# Patient Record
Sex: Male | Born: 1995 | Race: White | Hispanic: No | Marital: Single | Smoking: Never smoker
Health system: Southern US, Community
[De-identification: ages and names within clinical notes are randomized; demographics above are authoritative.]

## PROBLEM LIST (undated history)

## (undated) HISTORY — PX: OTHER SURGICAL HISTORY: SHX169

## (undated) HISTORY — PX: TONSILLECTOMY: SUR1361

---
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
## 2017-01-09 ENCOUNTER — Ambulatory Visit (INDEPENDENT_AMBULATORY_CARE_PROVIDER_SITE_OTHER): Payer: 59 | Admitting: Family Medicine

## 2017-01-09 ENCOUNTER — Telehealth: Payer: Self-pay | Admitting: Family Medicine

## 2017-01-09 ENCOUNTER — Ambulatory Visit (INDEPENDENT_AMBULATORY_CARE_PROVIDER_SITE_OTHER): Payer: 59

## 2017-01-09 ENCOUNTER — Encounter: Payer: Self-pay | Admitting: Family Medicine

## 2017-01-09 VITALS — BP 108/64 | HR 70 | Ht 72.0 in | Wt 168.0 lb

## 2017-01-09 DIAGNOSIS — M25512 Pain in left shoulder: Secondary | ICD-10-CM | POA: Diagnosis not present

## 2017-01-09 DIAGNOSIS — M7582 Other shoulder lesions, left shoulder: Secondary | ICD-10-CM

## 2017-01-09 MED ORDER — GADOBENATE DIMEGLUMINE 529 MG/ML IV SOLN
5.0000 mL | Freq: Once | INTRAVENOUS | Status: AC | PRN
  Administered 2017-01-09: 1 mL via INTRAVENOUS

## 2017-01-09 NOTE — Patient Instructions (Signed)
Thank you for coming in today. Get MRI now.  Call or go to the ER if you develop a large red swollen joint with extreme pain or oozing puss.  Use Chlorhexidine body wash.  Hibiclens is a good brand over the counter.  Use it every few days.

## 2017-01-09 NOTE — Progress Notes (Signed)
    Patient presents to clinic for  previously scheduled gadolinium interarticular contrast.  Procedure: Real-time Ultrasound Guided Injection of left GH injection  Device: GE Logiq E  Images permanently stored and available for review in the ultrasound unit. Verbal informed consent obtained. Discussed risks and benefits of procedure. Warned about infection bleeding damage to structures skin hypopigmentation and fat atrophy among others. Patient expresses understanding and agreement Time-out conducted.  Noted no overlying erythema, induration, or other signs of local infection.  Skin prepped in a sterile fashion.  Local anesthesia: Topical Ethyl chloride.  With sterile technique and under real time ultrasound guidance: 5ml marcaine, 0.6005ml gadolinium and 6ml sterile saline injected easily.  Completed without difficulty    Advised to call if fevers/chills, erythema, induration, drainage, or persistent bleeding.  Images permanently stored and available for review in the ultrasound unit.  Impression: Technically successful ultrasound guided injection.

## 2017-01-09 NOTE — Telephone Encounter (Signed)
I spoke with Andrew Boyd about his shoulder MRI results.  Plan for dedicated physical therapy and recheck as needed.

## 2017-01-16 ENCOUNTER — Ambulatory Visit (INDEPENDENT_AMBULATORY_CARE_PROVIDER_SITE_OTHER): Payer: 59 | Admitting: Physical Therapy

## 2017-01-16 DIAGNOSIS — M25512 Pain in left shoulder: Secondary | ICD-10-CM | POA: Diagnosis not present

## 2017-01-16 DIAGNOSIS — R293 Abnormal posture: Secondary | ICD-10-CM | POA: Diagnosis not present

## 2017-01-16 DIAGNOSIS — M6281 Muscle weakness (generalized): Secondary | ICD-10-CM

## 2017-01-16 NOTE — Patient Instructions (Addendum)
Scapula Adduction With Pectorals, Low   Stand in doorframe with palms against frame and arms at 45. Lean forward and squeeze shoulder blades. Hold __45_ seconds. Repeat _1__ times per session. Do _2__ sessions per day.  Scapula Adduction With Pectorals, Mid-Range   Stand in doorframe with palms against frame and arms at 90. Step forward and squeeze shoulder blades. Hold _45__ seconds. Repeat _1__ times per session. Do 2___ sessions per day. \Scapula Adduction With Pectorals, High - ONLY PERFORM IF PAIN IS LESS THAN 4/10   Stand in doorframe with palms against frame and arms at 120. Step forward and squeeze shoulder blades. Hold _45__ seconds. Repeat __1_ times per session. Do _2__ sessions per day.      Scapular Retraction: Abduction / Extension (Prone) - perform with palms down and then thumbs up.     Lie with arms out from sides 90. Pinch shoulder blades together and raise arms a few inches from floor. Repeat __10__ times per set. Do __3__ sets per session. Do __1_ sessions per day.  Scapular Retraction: Abduction (Prone)    Lie with upper arms straight out from sides, elbows bent to 90. Pinch shoulder blades together and raise arms a few inches from floor. Repeat __10__ times per set. Do __3__ sets per session. Do __1__ sessions per day.    Work on posture and position.  Keep shoulder blades pulled back and down like placing them in back pockets.  Don't perform floor push ups until shoulder pain is down.  Can do on a counter or table.    Use ice for pain as needed.

## 2017-01-16 NOTE — Therapy (Signed)
[facility] [address] [place], [place], [address] Phone: [phone]   Fax:  [phone]  Physical Therapy Evaluation  Patient Details  Name: Andrew NonesJoshua Madonia MRN: [id number] Date of Birth: 03/29/1995 Referring Provider: Dr Denyse Amassorey   Encounter Date: 01/16/2017  PT End of Session - 01/16/17 0849    Visit Number  1    Number of Visits  3    Date for PT Re-Evaluation  01/23/17    PT Start Time  0849    PT Stop Time  0922    PT Time Calculation (min)  33 min       No past medical history on file.  Past Surgical History:  Procedure Laterality Date  . neck     removal of nail   . TONSILLECTOMY      There were no vitals filed for this visit.   Subjective Assessment - 01/16/17 0849    Subjective  Josh reports he  began having Lt shoulder pain about 3 months ago, he was moving so he didn't get it checked up until now.  He is home for winter break. 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  No/denies has pain with pushups, bench presses         [facility] 01/16[facility]17 0001      Assessment   Medical Diagnosis  Lt shoulder pain     Referring Provider  Dr Denyse Amassorey    Onset Date/Surgical Date  10/16/16    Hand Dominance  Right    Next MD Visit  PRN    Prior Therapy  none      Precautions   Precautions  None      Balance Screen   Has the patient fallen in the past 6 months  No      Prior Function   Level of Independence  Independent    Teaching laboratory technicianVocation  Student    Vocation Requirements  college student    Leisure  work out, hunting, fishing, jutisu      Observation/Other Assessments   Focus on Therapeutic Outcomes (FOTO)   27% Limited      Posture/Postural Control   Posture/Postural Control  Postural limitations    Postural Limitations  Forward head;Rounded Shoulders      ROM /  Strength   AROM / PROM / Strength  AROM;Strength      AROM   Overall AROM Comments  rhomboids/mid traps 4/5    AROM Assessment Site  Shoulder;Cervical    Right/Left Shoulder  -- bilat WNL    Cervical Flexion  WNL    Cervical Extension  WNL    Cervical - Right Side Bend  WNL    Cervical - Left Side Bend  WNL    Cervical - Right Rotation  WNL    Cervical - Left Rotation  WNL      Strength   Strength Assessment Site  Shoulder;Elbow    Right/Left Shoulder  -- grossly WNL except Lt IR 5-/5    Right/Left Elbow  -- WNL      Palpation   Palpation comment  banding and tightness posterior Lt shoulder RTC tendons. Tight pecs bilat             Objective measurements completed on examination: See above findings.  [facility] Adult PT Treatment/Exercise - 01/16/17 0001      Exercises   Exercises  Shoulder      Shoulder Exercises: Prone   Other Prone Exercises  3x10 T's thumbs up and palms down, arms slightly lower than 90 degrees to minimize Lt shoulder pain    Other Prone Exercises  3x10 prone "goal posts" per HEP       Shoulder Exercises: Stretch   Other Shoulder Stretches  3 way door way stretch x 45 sec each              PT Education - 01/16/17 0911    Education provided  Yes    Education Details  HEP    Person(s) Educated  Patient    Methods  Explanation;Demonstration    Comprehension  Returned demonstration;Verbalized understanding          PT Long Term Goals - 01/16/17 1027      PT LONG TERM GOAL #1   Title  I with HEP for shoulder and upper back strengthening ( 01/20/17)     Time  1    Period  Weeks    Status  New      PT LONG TERM GOAL #2   Title  maintain FOTO within 5 points as he will not be able to come after this week ( 01/20/17)     Time  1    Period  Weeks    Status  New             Plan - 01/16/17 0926    Clinical Impression Statement  22 yo male home from college for winter break.  Returns to school this sunday.  He has Lt  shoulder pain for about 3 months, has continued to work out as he has basic training this summer and wants to b able to perform the required pushups and other physical  demands. He has significant forward posture, rounded shoulders along with upper back weakness and trigger oints in posterior Lt RTC muscles. He wishes to learn what exercises to perform when he returns to school next week that won't hurt his shoulder and will allow him to prepare for his summer.     Clinical Presentation  Stable    Clinical Decision Making  Low    Rehab Potential  Good for goals set    PT Frequency  3x / week    PT Duration  -- 1 week    PT Treatment/Interventions  Iontophoresis 4mg /ml Dexamethasone;Therapeutic activities;Patient/family education;Therapeutic exercise;Ultrasound;Cryotherapy;Civil engineer, contracting;Taping    PT Next Visit Plan  trigger point release with ball posterior Lt shoulder, add band ex for upper back and RTC strengthenin    Consulted and Agree with Plan of Care  Patient       Patient will benefit from skilled therapeutic intervention in order to improve the following deficits and impairments:  Impaired UE functional use, Decreased strength, Pain, Improper body mechanics  Visit Diagnosis: Acute pain of left shoulder - Plan: PT plan of care cert/re-cert  Muscle weakness (generalized) - Plan: PT plan of care cert/re-cert  Abnormal posture - Plan: PT plan of care cert/re-cert     Problem List There are no active problems to display for this patient.   Roderic Scarce PT  01/16/2017, 10:30 AM  [facility] [address] [place], [place], [address] Phone: [phone]   Fax:  [phone]  Name: Laden Fieldhouse MRN: [id number] Date of Birth: 27-Feb-1995

## 2017-01-18 ENCOUNTER — Ambulatory Visit (INDEPENDENT_AMBULATORY_CARE_PROVIDER_SITE_OTHER): Payer: 59 | Admitting: Physical Therapy

## 2017-01-18 DIAGNOSIS — R293 Abnormal posture: Secondary | ICD-10-CM

## 2017-01-18 DIAGNOSIS — M6281 Muscle weakness (generalized): Secondary | ICD-10-CM

## 2017-01-18 DIAGNOSIS — M25512 Pain in left shoulder: Secondary | ICD-10-CM

## 2017-01-18 NOTE — Patient Instructions (Signed)
Resisted External Rotation: in Neutral - Bilateral  PALMS UP!!! Sit or stand, tubing in both hands, elbows at sides, bent to 90, forearms forward. Pinch shoulder blades together and rotate forearms out. Keep elbows at sides. Repeat __10__ times per set. Do __2-3__ sets per session. Do __3-4__ sessions per week.  Resistive Band Rowing   With resistive band anchored in door, grasp both ends. Keeping elbows bent, pull back, squeezing shoulder blades together. Hold _3-5___ seconds. Repeat _10-30___ times. Do __1__ sessions per day.   Strengthening: Resisted Extension   Hold tubing with both hands, arms forward. Pull arms back, elbow straight. Repeat _10-30___ times per set. Do ____ sets per session. Do _1___ sessions per day.    * ball massage 3-5 min to pecs and back of shoulder.   * ice shoulder 15 min after workout if painful/irritated.

## 2017-01-18 NOTE — Therapy (Signed)
[facility] [address] [place], [place], [address] Phone: [phone]   Fax:  [phone]  Physical Therapy Treatment  Patient Details  Name: Andrew Boyd MRN: [id number] Date of Birth: 12/22/1995 Referring Provider: Dr. Denyse Amassorey   Encounter Date: 01/18/2017  PT End of Session - 01/18/17 0928    Visit Number  2    Number of Visits  3    Date for PT Re-Evaluation  01/23/17    PT Start Time  0849    PT Stop Time  0929    PT Time Calculation (min)  40 min    Activity Tolerance  Patient tolerated treatment well;No increased pain    Behavior During Therapy  [facility] for tasks assessed/performed       No past medical history on file.  Past Surgical History:  Procedure Laterality Date  . neck     removal of nail   . TONSILLECTOMY      There were no vitals filed for this visit.  Subjective Assessment - 01/18/17 0939    Subjective  Pt reports he had pain in Lt shoulder with pull ups yesterday.  He is anxious to return to push ups for preparation of basic training.     Currently in Pain?  No/denies         [facility] - 01/18/17 0001      Assessment   Medical Diagnosis  Lt shoulder pain     Referring Provider  Dr. Denyse Amassorey    Onset Date/Surgical Date  10/16/16    Hand Dominance  Right    Next MD Visit  PRN        [facility]/Exercise - 01/18/17 0001      Self-Care   Self-Care  Other Self-Care Comments;Heat/Ice Application    Heat/Ice Application  educated pt on parameters and rationale of ice application (along with use of home estim)    Other Self-Care Comments   Pt educated on self massage/ TPR with ball to shoulder girdle to reduce fascial tightness; pt returned demo and verbalized understanding      Shoulder Exercises: Prone   Flexion  Right;Left;10 reps;Weights 2 sets, 1st without wt    Flexion Weight (lbs)  2    Other Prone Exercises  T's thumbs up and palms down, arms slightly lower  than 90 degrees to minimize Lt shoulder pain, repeated with 2# (2 sets of 10)   Other Prone Exercises  prone "goal posts" x 10, repeated with 2#.       Shoulder Exercises: Standing   External Rotation  Both;20 reps;Theraband    Theraband Level (Shoulder External Rotation)  Level 3 (Green)    Extension  Both;20 reps;Theraband 5 sec hold with scap retraction     Theraband Level (Shoulder Extension)  Level 3 (Green)    Row  Both;10 reps;Theraband 5 sec hold in retraction      Shoulder Exercises: ROM/Strengthening   UBE (Upper Arm Bike)  L2:  1 min forward, 2 min backward       Shoulder Exercises: Stretch   Other Shoulder Stretches  3 way door way stretch x 30 sec each, 2 sets     Other Shoulder Stretches  shoulder ext stretch with hand on door frame, 30 sec x 2 , 2 sets                  PT Long Term Goals - 01/16/17 1027      PT LONG  TERM GOAL #1   Title  I with HEP for shoulder and upper back strengthening ( 01/20/17)     Time  1    Period  Weeks    Status  New      PT LONG TERM GOAL #2   Title  maintain FOTO within 5 points as he will not be able to come after this week ( 01/20/17)     Time  1    Period  Weeks    Status  New            Plan - 01/18/17 0930    Clinical Impression Statement  Pt tolerated exercise with increased resistance without any production of symptoms.  He needed freq cues for posture and to slow speed of exercise.  Added posterior shoulder girdle exercises without difficulty.  Pt progressing well towards established goals.     Rehab Potential  Good    PT Frequency  3x / week    PT Treatment/Interventions  Iontophoresis 4mg /ml Dexamethasone;Therapeutic activities;Patient/family education;Therapeutic exercise;Ultrasound;Cryotherapy;Civil engineer, contracting;Taping    PT Next Visit Plan  Finalize HEP for d/c (pt leaves for college on Sunday) FOTO    Consulted and Agree with Plan of Care  Patient       Patient will benefit  from skilled therapeutic intervention in order to improve the following deficits and impairments:  Impaired UE functional use, Decreased strength, Pain, Improper body mechanics  Visit Diagnosis: Acute pain of left shoulder  Muscle weakness (generalized)  Abnormal posture     Problem List There are no active problems to display for this patient.  Mayer Camel, PTA 01/18/17 9:43 AM  [facility] [address] [place], [place], [address] Phone: [phone]   Fax:  [phone]  Name: Andrew Boyd MRN: [id number] Date of Birth: [birth year]

## 2017-01-20 ENCOUNTER — Ambulatory Visit (INDEPENDENT_AMBULATORY_CARE_PROVIDER_SITE_OTHER): Payer: 59 | Admitting: Physical Therapy

## 2017-01-20 DIAGNOSIS — M6281 Muscle weakness (generalized): Secondary | ICD-10-CM

## 2017-01-20 DIAGNOSIS — M25512 Pain in left shoulder: Secondary | ICD-10-CM | POA: Diagnosis not present

## 2017-01-20 DIAGNOSIS — R293 Abnormal posture: Secondary | ICD-10-CM

## 2017-01-20 NOTE — Patient Instructions (Signed)
Push-Up Plus: On Toes    Place hands on floor and balance on toes. Perform a push-up. Give an extra push at end to bring shoulder blades forward on rib cage. Repeat ___ times per set. Rest ___ seconds after set. Do ___ sets per session.  * Plank with shoulder taps * walking planks   Chest / Bicep Stretch    Lace fingers behind back and squeeze shoulder blades together. Slowly raise and straighten arms. Hold __30__ seconds. Repeat _3___ times per set. Do __2__ sets per session.    [facility] [address] [place], [place] [address]  [phone] (office) [phone] (fax)

## 2017-01-20 NOTE — Therapy (Addendum)
[facility] [address] [place], [place], [address] Phone: [phone]   Fax:  [phone]  Physical Therapy Treatment  Patient Details  Name: Andrew Boyd MRN: [id number] Date of Birth: 04/05/1995 Referring Provider: Dr. Corey   Encounter Date: 01/20/2017  PT End of Session - 01/20/17 0858    Visit Number  3    Number of Visits  3    Date for PT Re-Evaluation  01/23/17    PT Start Time  0850    PT Stop Time  0928    PT Time Calculation (min)  38 min    Activity Tolerance  Patient tolerated treatment well;No increased pain    Behavior During Therapy  WFL for tasks assessed/performed       No past medical history on file.  Past Surgical History:  Procedure Laterality Date  . neck     removal of nail   . TONSILLECTOMY      There were no vitals filed for this visit.  Subjective Assessment - 01/20/17 0903    Subjective  Josh reports he was able to complete 60 incline push ups and 20 reg push ups without any pain. He           [facility] PT Assessment - 01/20/17 0001      Assessment   Medical Diagnosis  Lt shoulder pain     Referring Provider  Dr. Corey    Onset Date/Surgical Date  10/16/16    Hand Dominance  Right    Next MD Visit  PRN      Precautions   Precautions  None      Observation/Other Assessments   Focus on Therapeutic Outcomes (FOTO)   18% limited                  [facility] Adult PT Treatment/Exercise - 01/20/17 0001      Shoulder Exercises: Prone   Other Prone Exercises  prone on green therapy ball:  with 3# in each hand- T's, fly's, and goal posts -10 reps, 2 sets    Other Prone Exercises  prone goal post to superman bilat x 10 reps      Shoulder Exercises: Standing   External Rotation  Both;20 reps;Theraband;Limitations    Theraband Level (Shoulder External Rotation)  Level 3 (Green)    External Rotation Limitations  followed by 20 pulses in ER x 2 sets      Shoulder  Exercises: ROM/Strengthening   UBE (Upper Arm Bike)  L4: 2 min forward/ 2 min backward    Other ROM/Strengthening Exercises  Plank shoulder taps, with push ups x 10; opp arm leg in plank x 10 (poor form at times despite cues; push up plus x 10. Pt shown variations on plank/push up variations.       Shoulder Exercises: Stretch   Other Shoulder Stretches  3 way door way stretch x 30 sec each, 2 sets     Other Shoulder Stretches  shoulder ext stretch with hand on door frame, 30 sec x 2 , 2 sets                  PT Long Term Goals - 01/20/17 1519      PT LONG TERM GOAL #1   Title  I with HEP for shoulder and upper back strengthening ( 01/20/17)     Time  1    Period  Weeks    Status  Achieved      PT   LONG TERM GOAL #2   Title  maintain FOTO within 5 points as he will not be able to come after this week ( 01/20/17)     Time  1    Period  Weeks    Status  Achieved            Plan - 01/20/17 1520    Clinical Impression Statement  Pt tolerated exercises with increased difficulty without any production in symptoms.  Pt reported reduced pain with exercise outside of therapy yesterday.  Pt scored 18% limited on FOTO survey; improved from intake. Pt has met his goals and verbalized readiness for d/c.     Rehab Potential  Good    PT Frequency  3x / week    PT Treatment/Interventions  Iontophoresis 75m/ml Dexamethasone;Therapeutic activities;Patient/family education;Therapeutic exercise;Ultrasound;Cryotherapy;EHealth visitorTaping    PT Next Visit Plan  Spoke to supervising PT; will d/c to HEP.     Consulted and Agree with Plan of Care  Patient       Patient will benefit from skilled therapeutic intervention in order to improve the following deficits and impairments:  Impaired UE functional use, Decreased strength, Pain, Improper body mechanics  Visit Diagnosis: Acute pain of left shoulder  Muscle weakness (generalized)  Abnormal  posture     Problem List There are no active problems to display for this patient.   CShelbie Hutching1/11/2017, 3:23 PM  [facility] [address]Phone: [phone]  Fax:  [phone] Name: JXsavier SeeleyMRN: [id number] of Birth: [birth year]  PHYSICAL THERAPY DISCHARGE SUMMARY  Visits from Start of Care: 3  Current functional level related to goals / functional outcomes: See above   Remaining deficits: none   Education / Equipment:  HEP Plan: Patient agrees to discharge.  Patient goals were met. Patient is being discharged due to meeting the stated rehab goals.  ?????    SJeral Pinch PT 02/22/17 3:08 PM

## 2019-11-27 IMAGING — DX DG SHOULDER 2+V*L*
3 series · 3 of 3 positions shown · non-contrast
Comparison: None.

CLINICAL DATA: Left shoulder pain

EXAM:
LEFT SHOULDER - 2+ VIEW

[shoulder grashey]
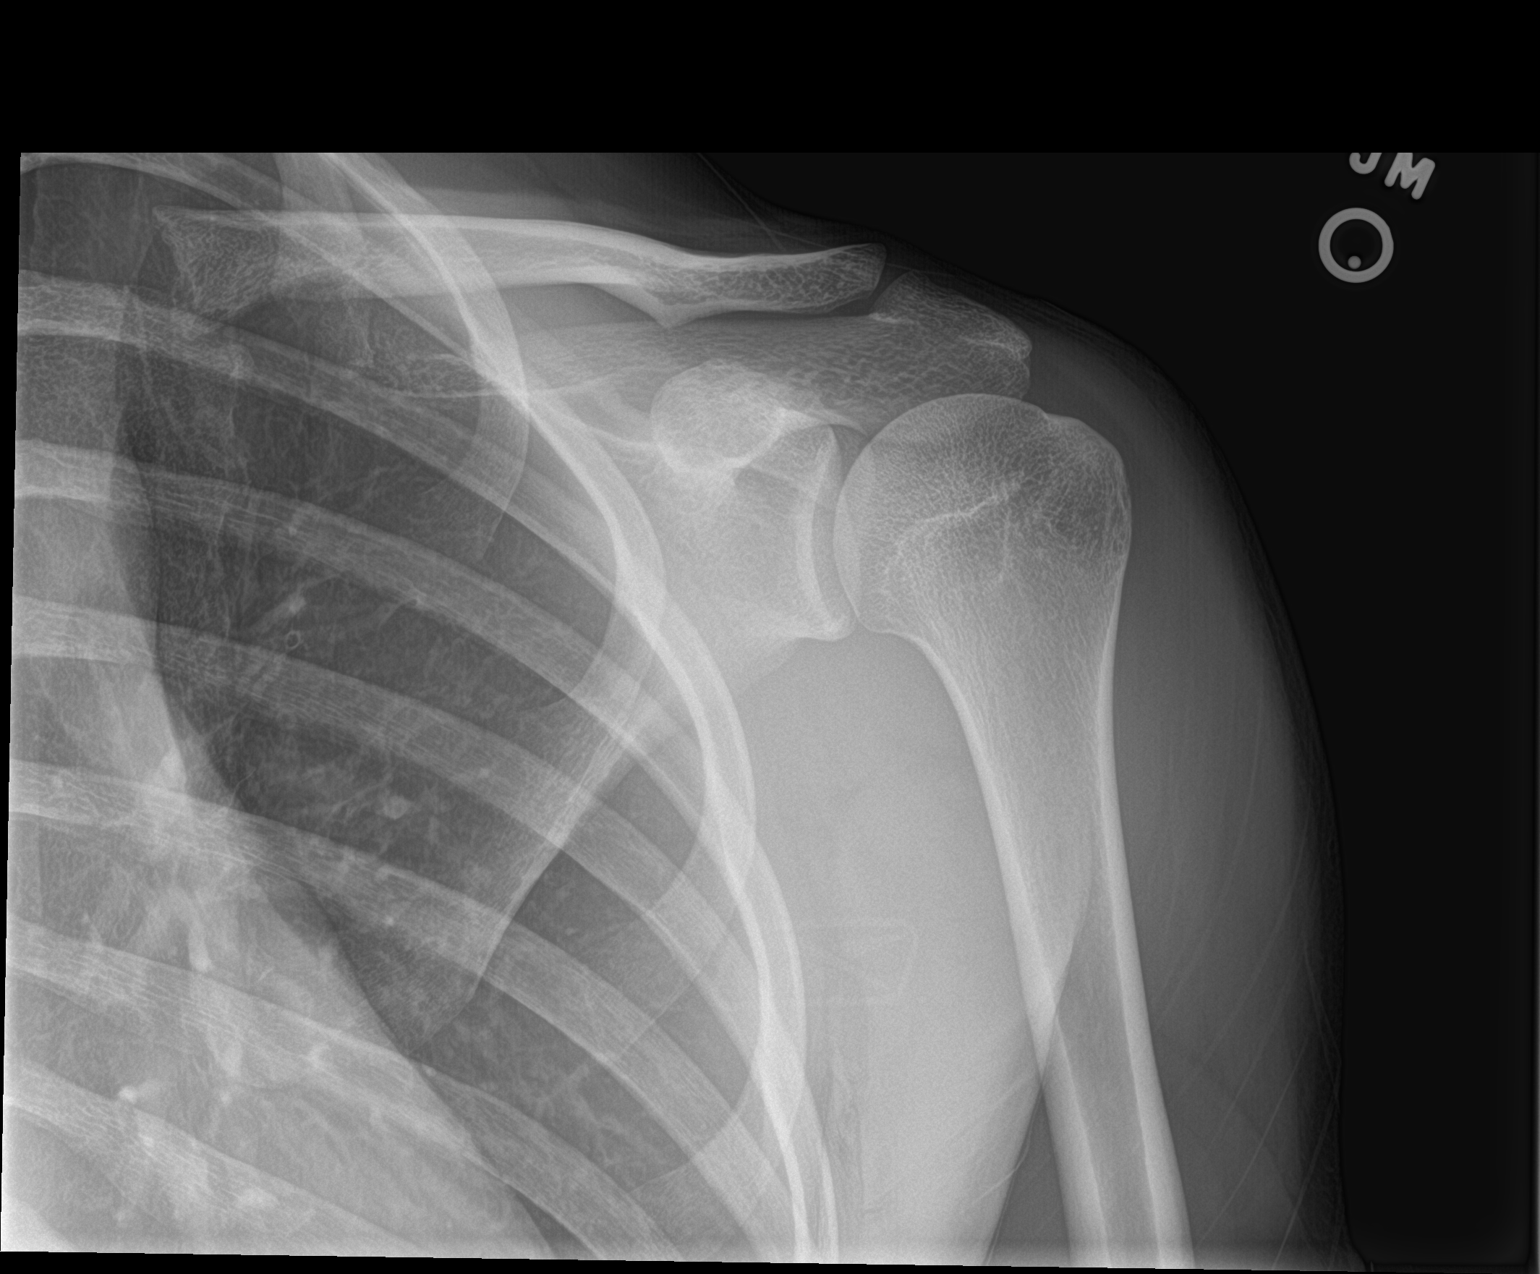

[shoulder y view]
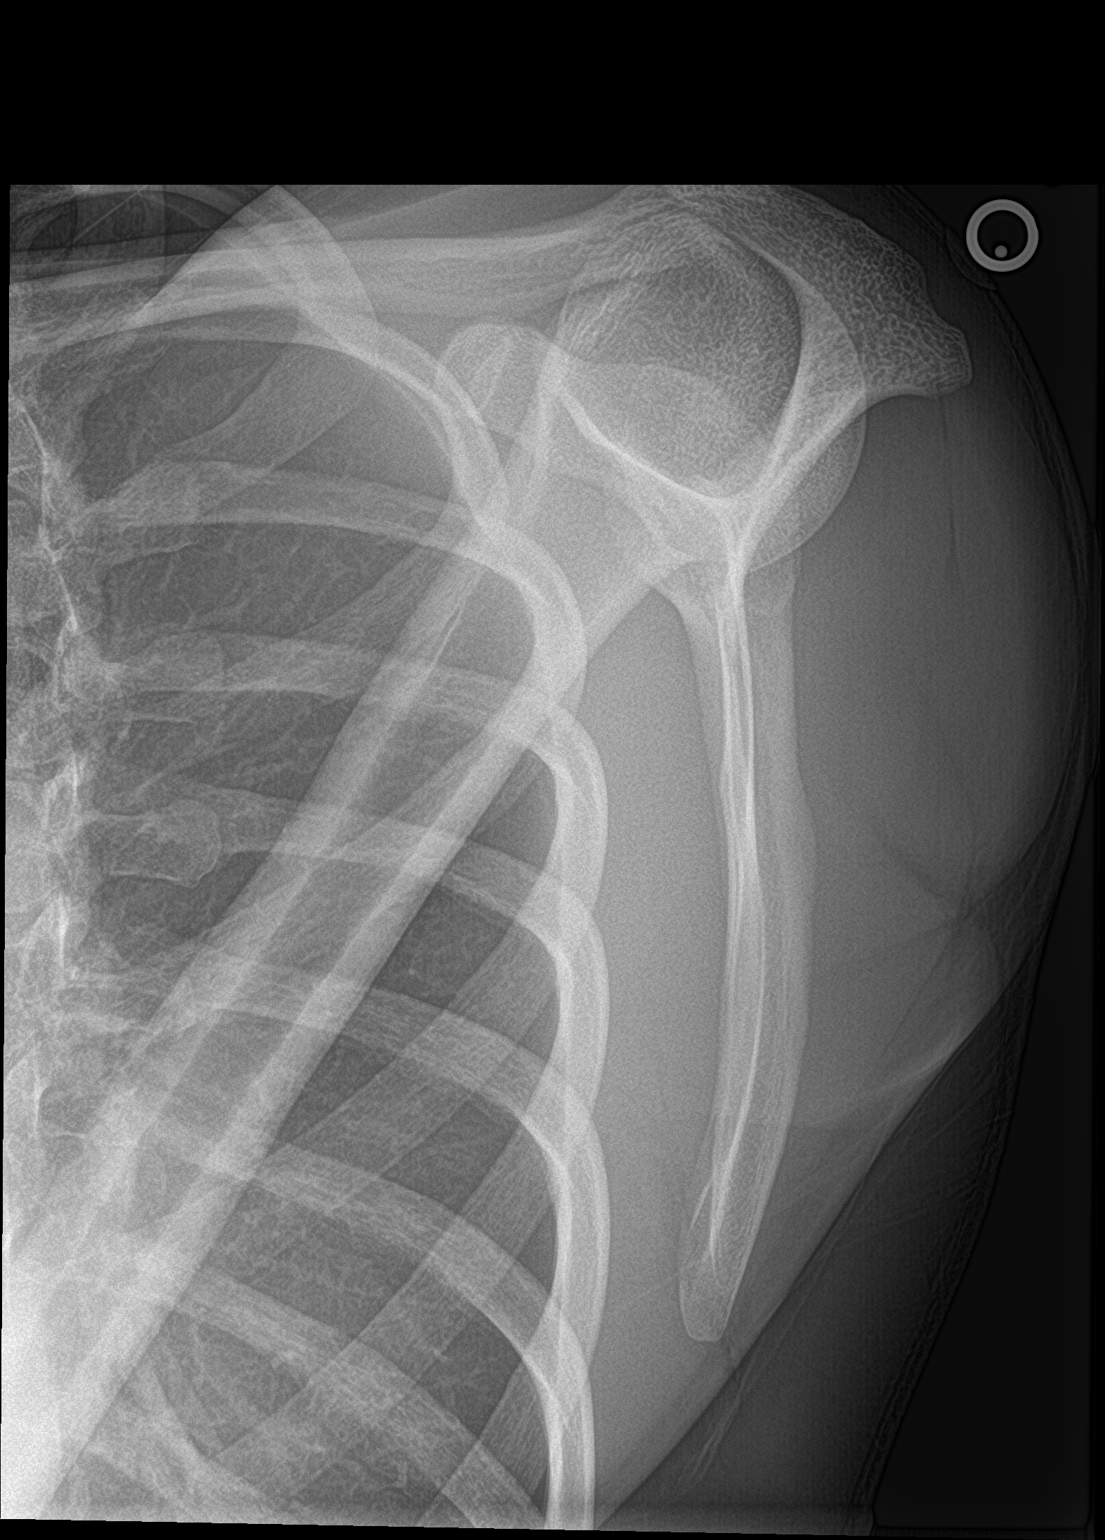

[shoulder axillary]
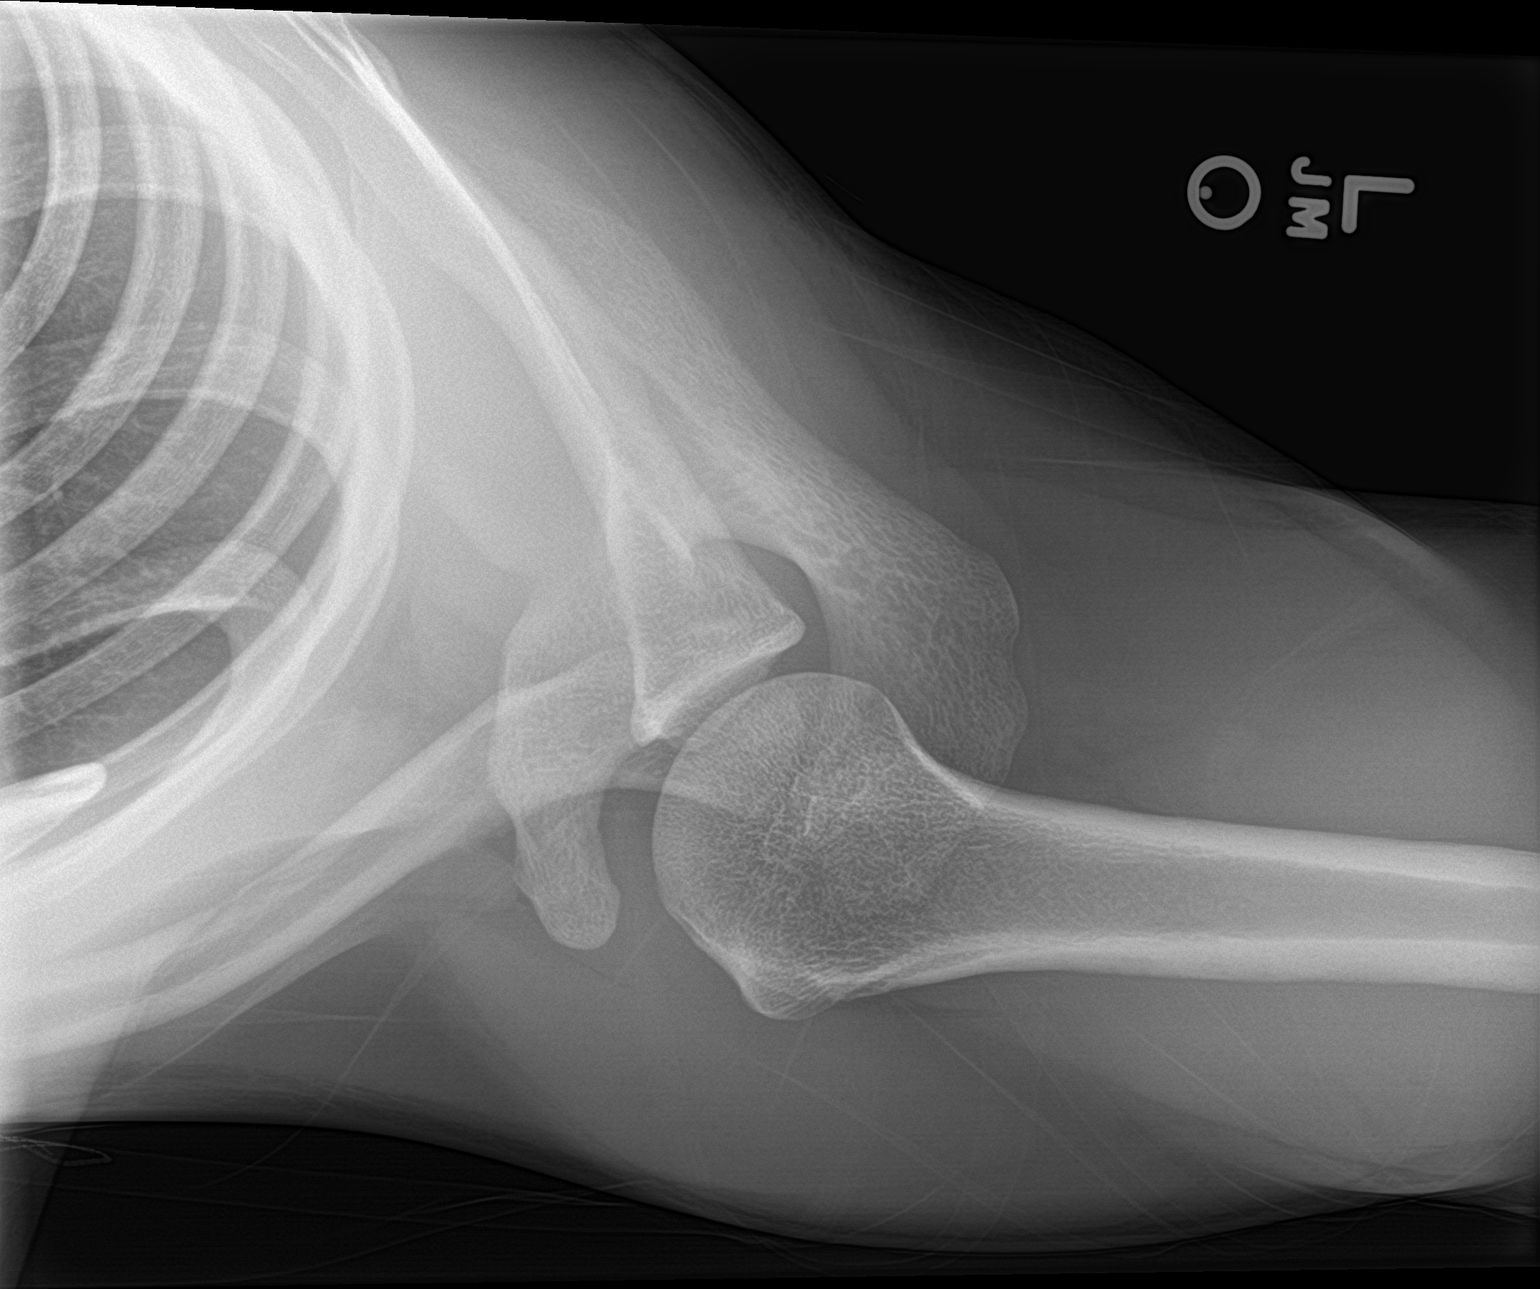

[3 of 3 positions shown; findings below may reference images not displayed]

FINDINGS: There is no evidence of fracture or dislocation. There is no
evidence of arthropathy or other focal bone abnormality. Soft
tissues are unremarkable.
IMPRESSION: Negative.
